# Patient Record
Sex: Female | Born: 2000
Health system: Southern US, Community
[De-identification: ages and names within clinical notes are randomized; demographics above are authoritative.]

## PROBLEM LIST (undated history)

## (undated) DIAGNOSIS — T7840XA Allergy, unspecified, initial encounter: Secondary | ICD-10-CM

## (undated) DIAGNOSIS — Z975 Presence of (intrauterine) contraceptive device: Secondary | ICD-10-CM

## (undated) DIAGNOSIS — F419 Anxiety disorder, unspecified: Secondary | ICD-10-CM

## (undated) HISTORY — PX: WISDOM TOOTH EXTRACTION: SHX21

## (undated) HISTORY — DX: Anxiety disorder, unspecified: F41.9

## (undated) HISTORY — DX: Presence of (intrauterine) contraceptive device: Z97.5

---
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
## 2020-07-22 IMAGING — DX PORTABLE CHEST - 1 VIEW
1 series · 1 of 1 positions shown · non-contrast
Comparison: None.

CLINICAL DATA: Cough and shortness of breath.

EXAM:
PORTABLE CHEST 1 VIEW

[chest]
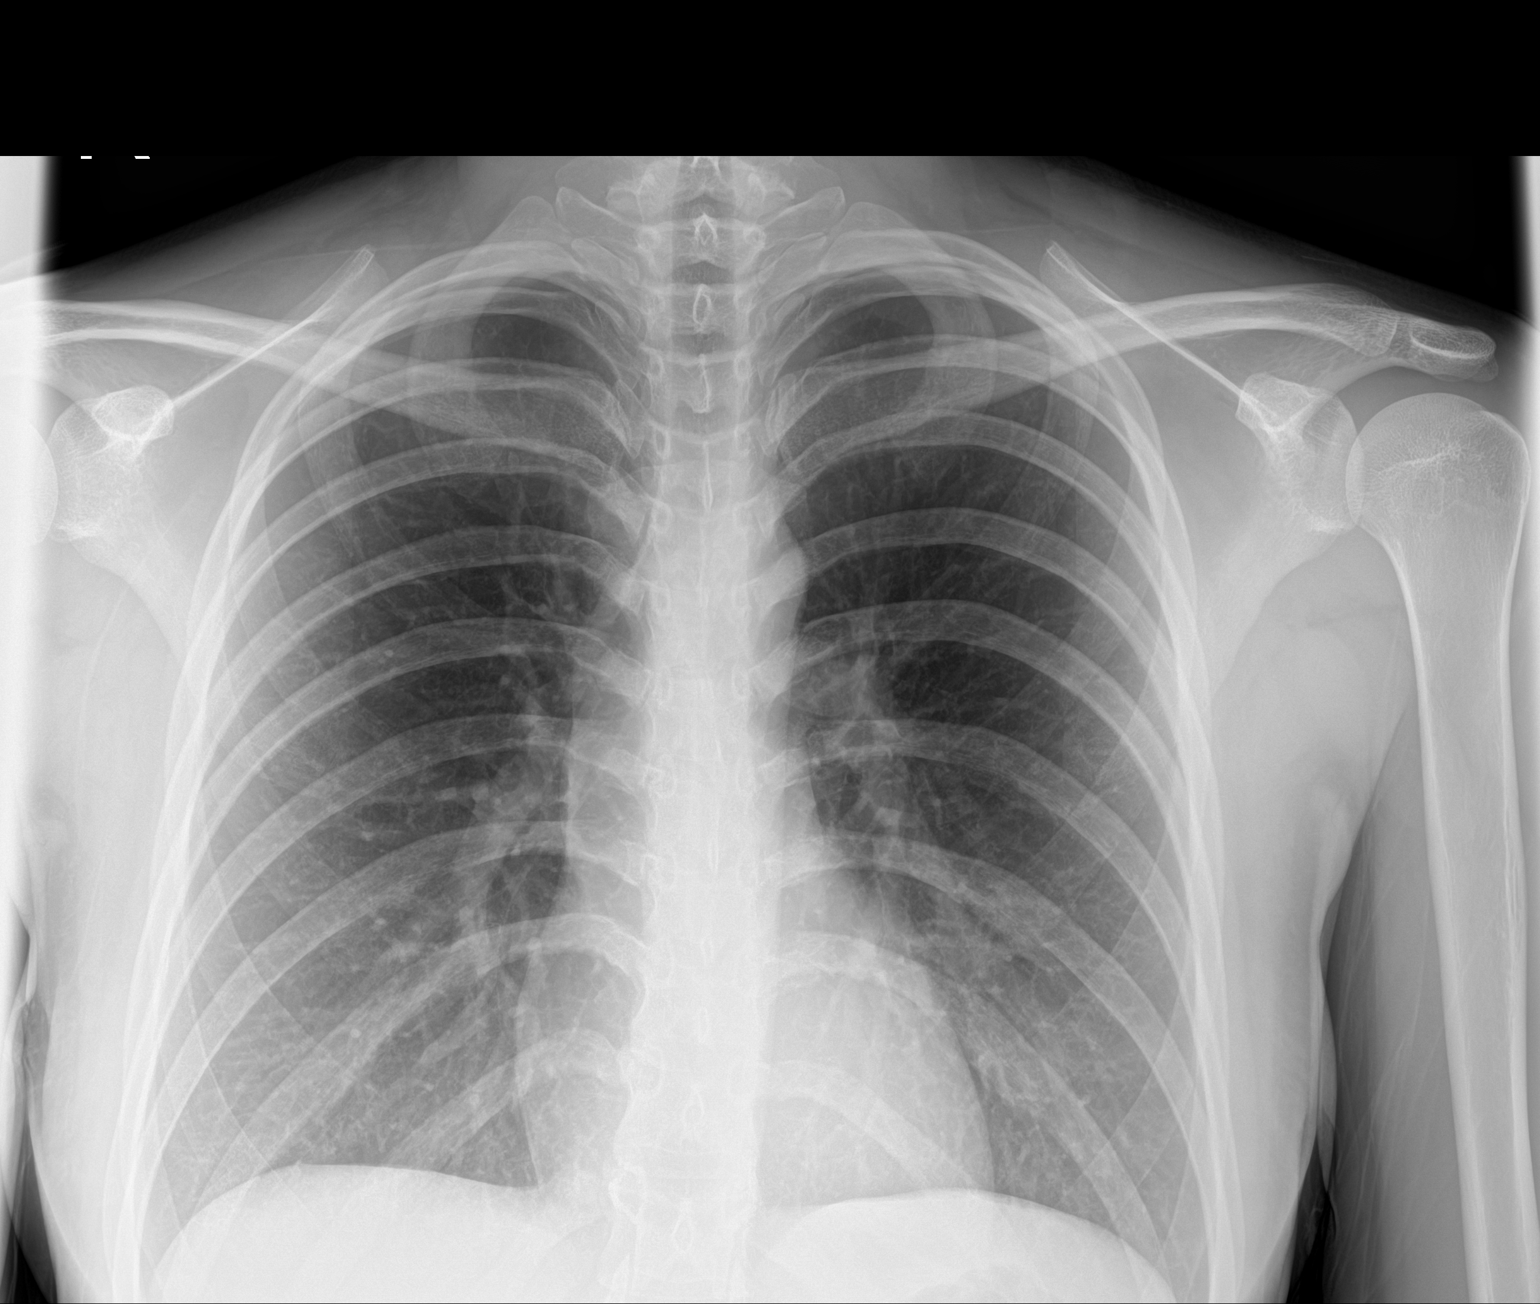

[1 of 1 positions shown; findings below may reference images not displayed]

FINDINGS: The cardiomediastinal contours are normal. The lungs are clear.
Pulmonary vasculature is normal. No consolidation, pleural effusion,
or pneumothorax. No acute osseous abnormalities are seen.
IMPRESSION: Negative radiograph of the chest.

## 2020-09-22 IMAGING — CT CT ABDOMEN AND PELVIS WITH CONTRAST
2 of 4 series · 16 of 46 positions shown, 18 images · IV contrast (omnipaque)
Comparison: 02/28/2019

CLINICAL DATA: Right lower quadrant pain for 2 days

EXAM:
CT ABDOMEN AND PELVIS WITH CONTRAST
TECHNIQUE: Multidetector CT imaging of the abdomen and pelvis was performed
using the standard protocol following bolus administration of
intravenous contrast.
CONTRAST:  100mL OMNIPAQUE IOHEXOL 300 MG/ML  SOLN

[Series 3: abd/ pelvis 5.0 i30f 2 · axial · 0.70mm/px · z∈[-686,-322]mm · 13 of 81 slices shown, 15 images]
[im 4/81  soft-tissue]
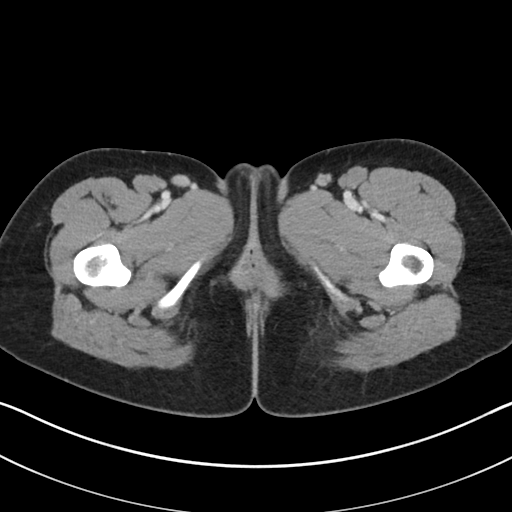
[im 4/81  bone]
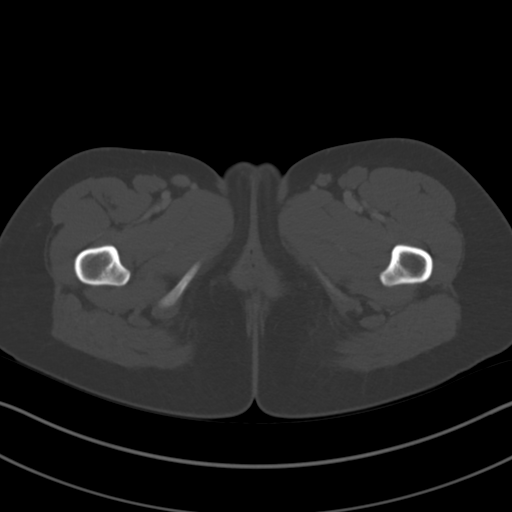
[im 10/81  soft-tissue]
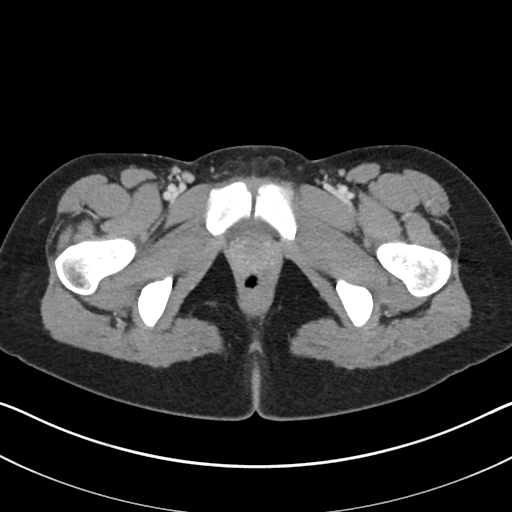
[im 17/81  soft-tissue]
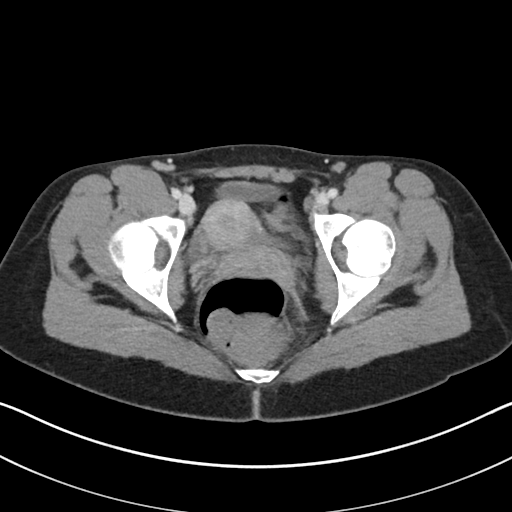
[im 23/81  soft-tissue]
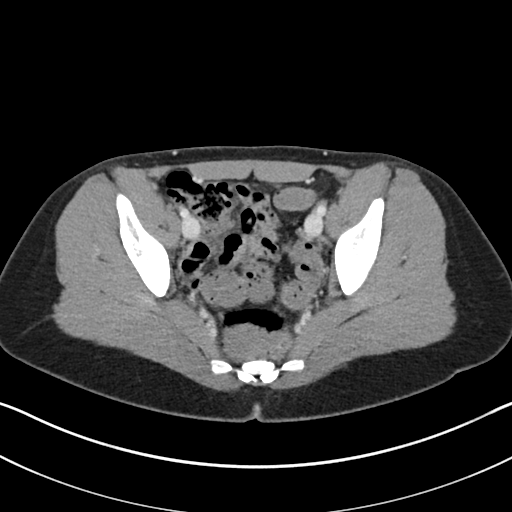
[im 29/81  soft-tissue]
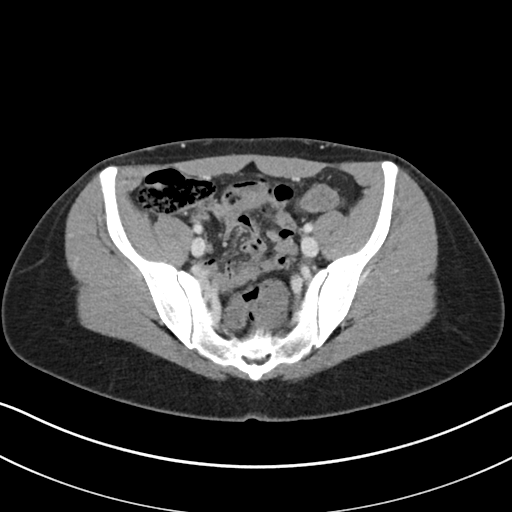
[im 36/81  soft-tissue]
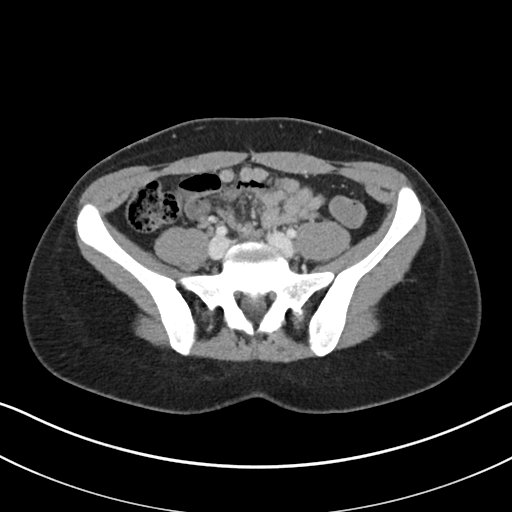
[im 42/81  soft-tissue]
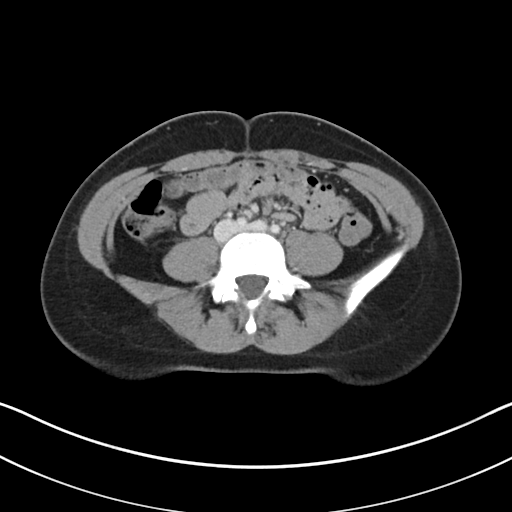
[im 45/81  soft-tissue]
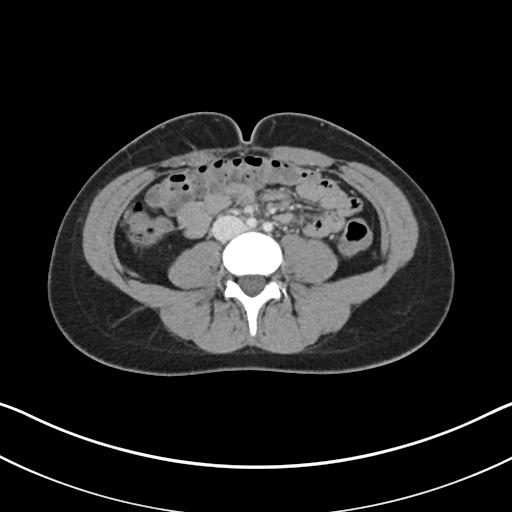
[im 52/81  soft-tissue]
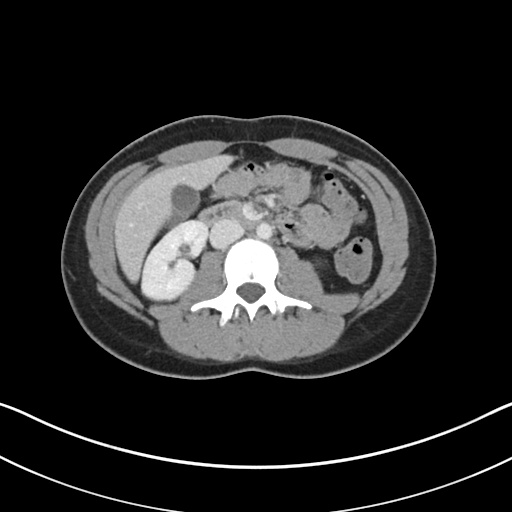
[im 52/81  bone]
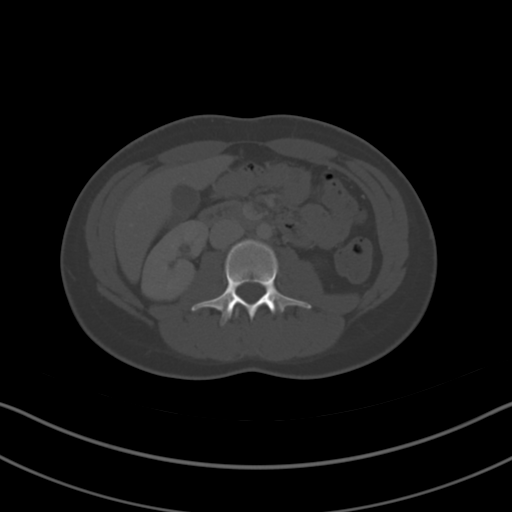
[im 58/81  soft-tissue]
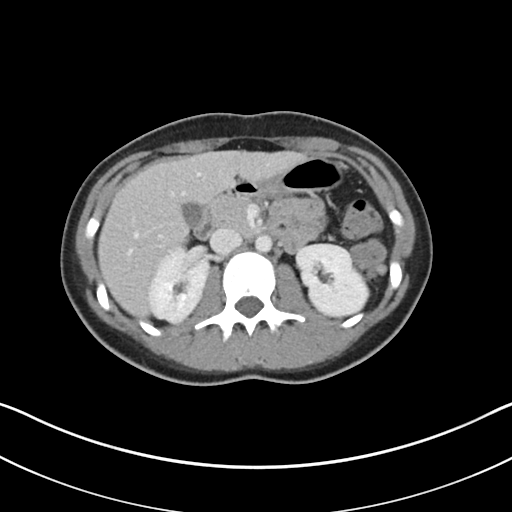
[im 65/81  soft-tissue]
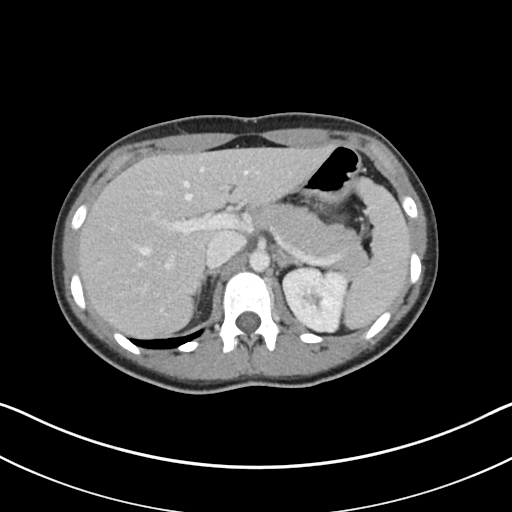
[im 71/81  soft-tissue]
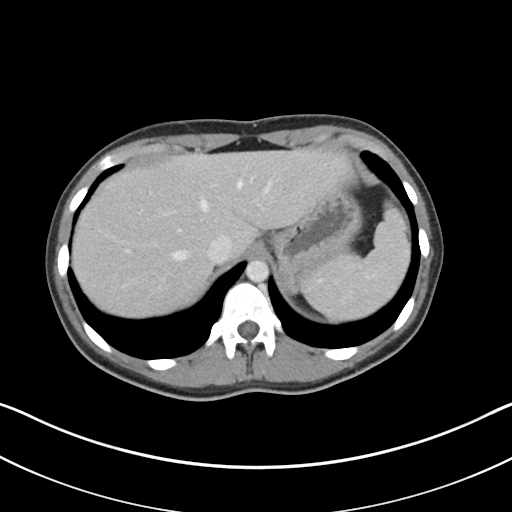
[im 77/81  soft-tissue]
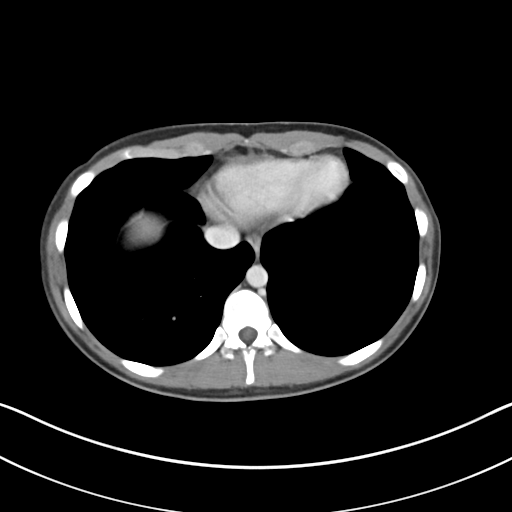

[Series 6: coronal soft tissue · coronal · 0.67mm/px · 3 of 101 slices shown]
[im 34/101  soft-tissue]
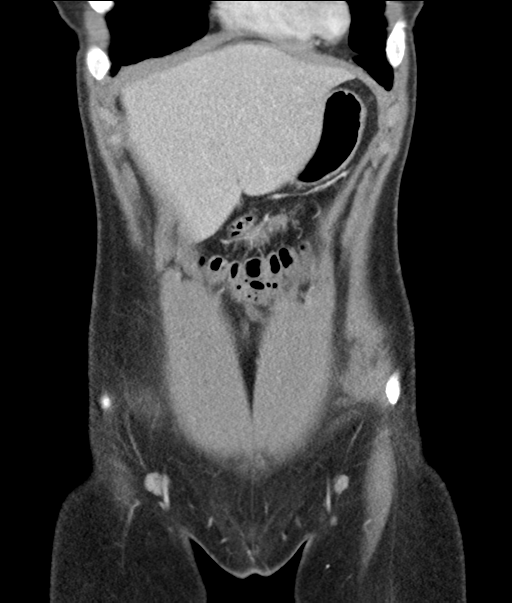
[im 45/101  soft-tissue]
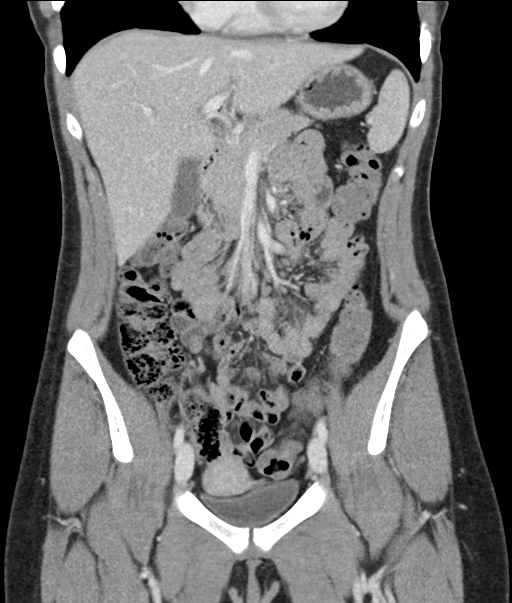
[im 56/101  soft-tissue]
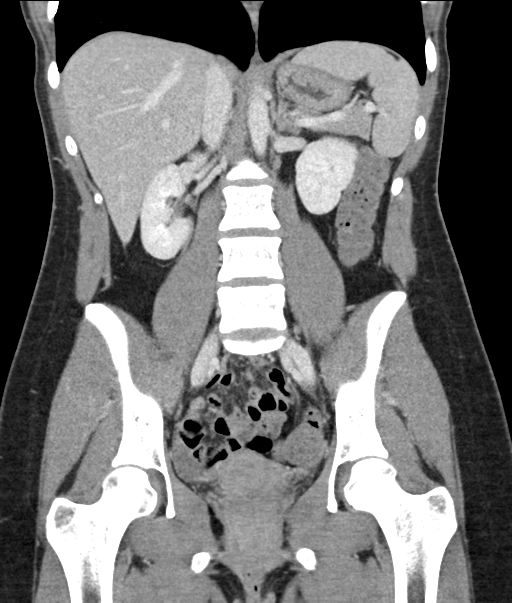

[16 of 46 positions shown; findings below may reference images not displayed]

FINDINGS: Lower chest: No acute abnormality.

Hepatobiliary: No focal liver abnormality is seen. No gallstones,
gallbladder wall thickening, or biliary dilatation.

Pancreas: Unremarkable. No pancreatic ductal dilatation or
surrounding inflammatory changes.

Spleen: Normal in size without focal abnormality.

Adrenals/Urinary Tract: Adrenal glands are within normal limits.
Kidneys demonstrate a normal enhancement pattern bilaterally. No
obstructive changes are seen. The bladder is partially distended.

Stomach/Bowel: The appendix is within normal limits. No obstructive
or inflammatory changes are seen. Stomach is within normal limits.

Vascular/Lymphatic: No significant vascular findings are present. No
enlarged abdominal or pelvic lymph nodes.

Reproductive: Uterus and bilateral adnexa are unremarkable.

Other: No abdominal wall hernia or abnormality. No abdominopelvic
ascites.

Musculoskeletal: No acute or significant osseous findings.
IMPRESSION: No evidence of acute appendicitis.  No acute abnormality noted.

## 2021-04-30 ENCOUNTER — Other Ambulatory Visit: Payer: Self-pay | Admitting: Surgery

## 2021-04-30 DIAGNOSIS — R1011 Right upper quadrant pain: Secondary | ICD-10-CM

## 2021-05-04 ENCOUNTER — Encounter (HOSPITAL_COMMUNITY)
Admission: RE | Admit: 2021-05-04 | Discharge: 2021-05-04 | Disposition: A | Discharge status: Home / Self Care | Payer: 59 | Source: Ambulatory Visit | Attending: Surgery | Admitting: Surgery

## 2021-05-04 ENCOUNTER — Other Ambulatory Visit: Payer: Self-pay

## 2021-05-04 DIAGNOSIS — R1011 Right upper quadrant pain: Secondary | ICD-10-CM | POA: Diagnosis present

## 2021-05-04 MED ORDER — TECHNETIUM TC 99M MEBROFENIN IV KIT
5.2000 | PACK | Freq: Once | INTRAVENOUS | Status: AC | PRN
  Administered 2021-05-04: 5.2 via INTRAVENOUS

## 2021-05-14 ENCOUNTER — Encounter: Payer: Self-pay | Admitting: Gastroenterology

## 2021-05-14 ENCOUNTER — Telehealth: Payer: Self-pay | Admitting: Gastroenterology

## 2021-05-14 NOTE — Telephone Encounter (Signed)
Schedule OV 10/4

## 2021-05-14 NOTE — Telephone Encounter (Signed)
I am happy to see her as a new patient.  Please offer her my first available new patient appointment.  Do not double book and do not book with an extender place thank you

## 2021-05-14 NOTE — Telephone Encounter (Signed)
Hey Dr. Christella Hartigan,   We received a referral for transfer of care for postprandial abd pain RUQ. She was recently seen at [facility] in [place]. She would like to see you as you are local. Records are in Epic. Could you please review and advise on scheduling?  Thank you.

## 2021-07-10 ENCOUNTER — Ambulatory Visit (INDEPENDENT_AMBULATORY_CARE_PROVIDER_SITE_OTHER): Payer: 59 | Admitting: Gastroenterology

## 2021-07-10 ENCOUNTER — Encounter: Payer: Self-pay | Admitting: Gastroenterology

## 2021-07-10 DIAGNOSIS — K219 Gastro-esophageal reflux disease without esophagitis: Secondary | ICD-10-CM

## 2021-07-10 DIAGNOSIS — R109 Unspecified abdominal pain: Secondary | ICD-10-CM | POA: Diagnosis not present

## 2021-07-10 MED ORDER — FAMOTIDINE 20 MG PO TABS: 20.0000 mg | ORAL_TABLET | Freq: Every day | ORAL | 3 refills | Status: DC

## 2021-07-10 MED ORDER — OMEPRAZOLE 20 MG PO CPDR: 20.0000 mg | DELAYED_RELEASE_CAPSULE | Freq: Every day | ORAL | 3 refills | Status: DC

## 2021-07-10 NOTE — Patient Instructions (Signed)
If you are age 20 or younger, your body mass index should be between 19-25. Your Body mass index is 27.63 kg/m. If this is out of the aformentioned range listed, please consider follow up with your Primary Care Provider.  __________________________________________________________  The [facility] providers would like to encourage you to use [facility] to communicate with providers for non-urgent requests or questions.  Due to long hold times on the telephone, sending your provider a message by [facility] may be a faster and more efficient way to get a response.  Please allow 48 business hours for a response.  Please remember that this is for non-urgent requests.   You have been scheduled for an endoscopy. Please follow written instructions given to you at your visit today. If you use inhalers (even only as needed), please bring them with you on the day of your procedure.  Due to recent changes in healthcare laws, you may see the results of your imaging and laboratory studies on MyChart before your provider has had a chance to review them.  We understand that in some cases there may be results that are confusing or concerning to you. Not all laboratory results come back in the same time frame and the provider may be waiting for multiple results in order to interpret others.  Please give [place] 48 hours in order for your provider to thoroughly review all the results before contacting the office for clarification of your results.   We have sent the following medications to your pharmacy for you to pick up at your convenience:  START: omeprazole one capsule daily 20 to 30 minutes prior to breakfast.  START: Pepcid 20mg  one tablet every night at bedtime.  Thank you for entrusting me with your care and choosing [facility].  Dr [facility]

## 2021-07-10 NOTE — Progress Notes (Signed)
HPI: This is a very pleasant 20 year old woman who was referred to me by Manus Rudd, MD  to evaluate GERD, abdominal pain.    Since she was born she has had difficulties with her stomach.  Her mother told her she used to vomit a lot as an infant.  She has had acid reflux type issues for many years.  She points back to 2015 when she saw gastroenterologist who put her on 3 weeks of ranitidine and she felt better.  More recently she has had worsening burning in her chest acid taste in her mouth.  Also postprandial epigastric and right upper quadrant discomfort associate with a lot of bloating.  She avoids dairy and this helps a bit with some symptoms.  She takes Gaviscon periodically and that sometimes helps.  She will wake up just about every morning and have a lot of belching.  She has been told this is IBS.  She drinks 1-2 espresso coffees a day.  She does not usually eat within 2 to 3 hours of laying down for bed.  She takes NSAIDs about twice a week.  She is not a very heavy alcohol drinker.  Her weight is overall stable.  She has no dysphagia.  Her bowels are normal, regular.  Old Data Reviewed:  Ultrasound right upper quadrant May 2022 indication epigastric pain findings normal exam. Ultrasound right upper quadrant July 2022 indication right upper quadrant pain and nausea.  Findings "gallbladder is contracted.  No appreciable cholelithiasis or evidence of cholecystitis.  Mild hepatic steatosis.  HIDA scan July 2022 indication "postprandial epigastric pain.  Findings normal  Blood work through [facility] 2022 celiac sprue TTG and total IgA were normal.  Complete metabolic panel normal, lipase normal    Review of systems: Pertinent positive and negative review of systems were noted in the above HPI section. All other review negative.   Past Medical History:  Diagnosis Date   Allergies     History reviewed. No pertinent surgical history.  Current Outpatient Medications  Medication Sig  Dispense Refill   DULoxetine HCl (CYMBALTA PO) Take 40 mg by mouth daily.     Levonorgestrel (LILETTA, 52 MG, IU) by Intrauterine route.     No current facility-administered medications for this visit.    Allergies as of 07/10/2021   (No Known Allergies)    Family History  Problem Relation Age of Onset   Colon polyps Neg Hx    Colonic polyp Neg Hx    Rectal cancer Neg Hx    Esophageal cancer Neg Hx    Stomach cancer Neg Hx     Social History   Socioeconomic History   Marital status: Single    Spouse name: Not on file   Number of children: Not on file   Years of education: Not on file   Highest education level: Not on file  Occupational History   Occupation: student  Tobacco Use   Smoking status: Never   Smokeless tobacco: Never  Substance and Sexual Activity   Alcohol use: Never   Drug use: Never   Sexual activity: Not on file  Other Topics Concern   Not on file  Social History Narrative   Not on file   Social Determinants of Health   Financial Resource Strain: Not on file  Food Insecurity: Not on file  Transportation Needs: Not on file  Physical Activity: Not on file  Stress: Not on file  Social Connections: Not on file  Intimate Partner Violence: Not on  file     Physical Exam: Ht 5\' 1"  (1.549 m)   Wt 146 lb 4 oz (66.3 kg)   BMI 27.63 kg/m  Constitutional: generally well-appearing Psychiatric: alert and oriented x3 Eyes: extraocular movements intact Mouth: oral pharynx moist, no lesions Neck: supple no lymphadenopathy Cardiovascular: heart regular rate and rhythm Lungs: clear to auscultation bilaterally Abdomen: soft, nontender, nondistended, no obvious ascites, no peritoneal signs, normal bowel sounds Extremities: no lower extremity edema bilaterally Skin: no lesions on visible extremities   Assessment and plan: 20 y.o. female with GERD, epigastric and right upper quadrant postprandial discomforts  I do think that acid is probably playing a  role in her symptoms.  H2 blockers in the past have helped.  I recommended she start taking Pepcid 20 mg 1 pill every night at bedtime and also omeprazole 20 mg every morning shortly before her first meal of the day.  I think that this would be a good test and also treatment for any acid related issues.  I also recommended that we proceed with EGD at her soonest convenience to check for significant gastritis, peptic ulcer disease, H. pylori, pyloric stricture given her vomiting as an infant.  I see no reason for any further blood tests or imaging studies prior to then.   Please see the "Patient Instructions" section for addition details about the plan.   26, MD [facility] 07/10/2021, 8:19 AM  Cc: 09/09/2021, MD  Total time on date of encounter was 45 minutes (this included time spent preparing to see the patient reviewing records; obtaining and/or reviewing separately obtained history; performing a medically appropriate exam and/or evaluation; counseling and educating the patient and family if present; ordering medications, tests or procedures if applicable; and documenting clinical information in the health record).

## 2021-07-30 ENCOUNTER — Encounter: Payer: Self-pay | Admitting: Gastroenterology

## 2021-07-30 ENCOUNTER — Ambulatory Visit (AMBULATORY_SURGERY_CENTER): Payer: 59 | Admitting: Gastroenterology

## 2021-07-30 ENCOUNTER — Other Ambulatory Visit: Payer: Self-pay

## 2021-07-30 VITALS — BP 98/66 | HR 60 | Temp 99.1°F | Resp 20 | Ht 61.0 in | Wt 146.0 lb

## 2021-07-30 DIAGNOSIS — K219 Gastro-esophageal reflux disease without esophagitis: Secondary | ICD-10-CM | POA: Diagnosis not present

## 2021-07-30 DIAGNOSIS — K2970 Gastritis, unspecified, without bleeding: Secondary | ICD-10-CM

## 2021-07-30 DIAGNOSIS — R12 Heartburn: Secondary | ICD-10-CM | POA: Diagnosis present

## 2021-07-30 DIAGNOSIS — R109 Unspecified abdominal pain: Secondary | ICD-10-CM

## 2021-07-30 MED ORDER — SODIUM CHLORIDE 0.9 % IV SOLN: 500.0000 mL | INTRAVENOUS | Status: DC

## 2021-07-30 NOTE — Progress Notes (Signed)
To PACU, VSS. Report to [email or web address] 

## 2021-07-30 NOTE — Progress Notes (Signed)
Called to room to assist during endoscopic procedure.  Patient ID and intended procedure confirmed with present staff. Received instructions for my participation in the procedure from the performing physician.  

## 2021-07-30 NOTE — Patient Instructions (Addendum)
Awaiting pathology results. Will consider gastric emptying scan if biopsies show no H. Pylori. Resume previous diet and current medications.  YOU HAD AN ENDOSCOPIC PROCEDURE TODAY AT THE [facility]:   Refer to the procedure report that was given to you for any specific questions about what was found during the examination.  If the procedure report does not answer your questions, please call your gastroenterologist to clarify.  If you requested that your care partner not be given the details of your procedure findings, then the procedure report has been included in a sealed envelope for you to review at your convenience later.  YOU SHOULD EXPECT: Some feelings of bloating in the abdomen. Passage of more gas than usual.  Walking can help get rid of the air that was put into your GI tract during the procedure and reduce the bloating. If you had a lower endoscopy (such as a colonoscopy or flexible sigmoidoscopy) you may notice spotting of blood in your stool or on the toilet paper. If you underwent a bowel prep for your procedure, you may not have a normal bowel movement for a few days.  Please Note:  You might notice some irritation and congestion in your nose or some drainage.  This is from the oxygen used during your procedure.  There is no need for concern and it should clear up in a day or so.  SYMPTOMS TO REPORT IMMEDIATELY:   Following upper endoscopy (EGD)  Vomiting of blood or coffee ground material  New chest pain or pain under the shoulder blades  Painful or persistently difficult swallowing  New shortness of breath  Fever of 100F or higher  Black, tarry-looking stools  For urgent or emergent issues, a gastroenterologist can be reached at any hour by calling [phone]. Do not use MyChart messaging for urgent concerns.    DIET:  We do recommend a small meal at first, but then you may proceed to your regular diet.  Drink plenty of fluids but you should avoid alcoholic  beverages for 24 hours.  ACTIVITY:  You should plan to take it easy for the rest of today and you should NOT DRIVE or use heavy machinery until tomorrow (because of the sedation medicines used during the test).    FOLLOW UP: Our staff will call the number listed on your records 48-72 hours following your procedure to check on you and address any questions or concerns that you may have regarding the information given to you following your procedure. If we do not reach you, we will leave a message.  We will attempt to reach you two times.  During this call, we will ask if you have developed any symptoms of COVID 19. If you develop any symptoms (ie: fever, flu-like symptoms, shortness of breath, cough etc.) before then, please call [phone].  If you test positive for Covid 19 in the 2 weeks post procedure, please call and report this information to [place].    If any biopsies were taken you will be contacted by phone or by letter within the next 1-3 weeks.  Please call us at [phone] if you have not heard about the biopsies in 3 weeks.    SIGNATURES/CONFIDENTIALITY: You and/or your care partner have signed paperwork which will be entered into your electronic medical record.  These signatures attest to the fact that that the information above on your After Visit Summary has been reviewed and is understood.  Full responsibility of the confidentiality of this discharge  information lies with you and/or your care-partner.

## 2021-07-30 NOTE — Progress Notes (Signed)
  The recent H&P (dated 07/08/2021) was reviewed, the patient was examined and there is no change in the patients condition since that H&P was completed.   Laura Marquez  07/30/2021, 10:07 AM

## 2021-07-30 NOTE — Op Note (Signed)
[facility] Patient Name: Laura Marquez Procedure Date: 07/30/2021 10:25 AM MRN: [id number] Endoscopist: Rachael Fee , MD Age: 20 Referring MD:  Date of Birth: July 04, 2001 Gender: Female Account #: [id number] Procedure:                Upper GI endoscopy Indications:              Abdominal pain in the right upper quadrant,                            Heartburn Medicines:                Monitored Anesthesia Care Procedure:                Pre-Anesthesia Assessment:                           - Prior to the procedure, a History and Physical                            was performed, and patient medications and                            allergies were reviewed. The patient's tolerance of                            previous anesthesia was also reviewed. The risks                            and benefits of the procedure and the sedation                            options and risks were discussed with the patient.                            All questions were answered, and informed consent                            was obtained. Prior Anticoagulants: The patient has                            taken no previous anticoagulant or antiplatelet                            agents. ASA Grade Assessment: II - A patient with                            mild systemic disease. After reviewing the risks                            and benefits, the patient was deemed in                            satisfactory condition to undergo the procedure.  After obtaining informed consent, the endoscope was                            passed under direct vision. Throughout the                            procedure, the patient's blood pressure, pulse, and                            oxygen saturations were monitored continuously. The                            Endoscope was introduced through the mouth, and                            advanced to the second part of duodenum. The  upper                            GI endoscopy was accomplished without difficulty.                            The patient tolerated the procedure well. Scope In: Scope Out: Findings:                 Small to moderate amount of retained gastric                            secretions without anatomic outlet obstruction.                           Mild inflammation characterized by erythema and                            friability was found in the gastric antrum.                            Biopsies were taken with a cold forceps for                            histology.                           The exam was otherwise without abnormality. Complications:            No immediate complications. Estimated blood loss:                            None. Estimated Blood Loss:     Estimated blood loss: none. Impression:               - Mild, non-specific gastritis. Biopsied to check                            for H. pylori.                           -  Small to moderate amount of retained gastric                            secretions without anatomic outlet obstruction.                           - The examination was otherwise normal. Recommendation:           - Await pathology results. Will consider gastric                            emptying scan if biopsies show no H. pylori.                           - Patient has a contact number available for                            emergencies. The signs and symptoms of potential                            delayed complications were discussed with the                            patient. Return to normal activities tomorrow.                            Written discharge instructions were provided to the                            patient.                           - Resume previous diet.                           - Continue present medications. Rachael Fee, MD 07/30/2021 10:39:41 AM This report has been signed electronically.

## 2021-08-01 ENCOUNTER — Encounter: Payer: 59 | Admitting: Gastroenterology

## 2021-08-01 ENCOUNTER — Telehealth: Payer: Self-pay

## 2021-08-01 NOTE — Telephone Encounter (Signed)
Left message on answering machine. 

## 2021-08-06 ENCOUNTER — Other Ambulatory Visit: Payer: Self-pay

## 2021-08-06 DIAGNOSIS — R109 Unspecified abdominal pain: Secondary | ICD-10-CM

## 2021-08-06 DIAGNOSIS — K3189 Other diseases of stomach and duodenum: Secondary | ICD-10-CM

## 2021-09-12 ENCOUNTER — Other Ambulatory Visit: Payer: Self-pay

## 2021-09-12 ENCOUNTER — Encounter (HOSPITAL_COMMUNITY)
Admission: RE | Admit: 2021-09-12 | Discharge: 2021-09-12 | Disposition: A | Discharge status: Home / Self Care | Payer: 59 | Source: Ambulatory Visit | Attending: Gastroenterology | Admitting: Gastroenterology

## 2021-09-12 DIAGNOSIS — R109 Unspecified abdominal pain: Secondary | ICD-10-CM | POA: Insufficient documentation

## 2021-09-12 DIAGNOSIS — K3189 Other diseases of stomach and duodenum: Secondary | ICD-10-CM

## 2021-09-12 MED ORDER — TECHNETIUM TC 99M SULFUR COLLOID
1.8000 | Freq: Once | INTRAVENOUS | Status: AC
  Administered 2021-09-12: 1.8 via INTRAVENOUS

## 2021-10-06 ENCOUNTER — Other Ambulatory Visit: Payer: Self-pay | Admitting: Gastroenterology

## 2022-08-27 ENCOUNTER — Emergency Department (HOSPITAL_BASED_OUTPATIENT_CLINIC_OR_DEPARTMENT_OTHER)
Admission: EM | Admit: 2022-08-27 | Discharge: 2022-08-27 | Disposition: A | Discharge status: Home / Self Care | Payer: 59 | Attending: Emergency Medicine | Admitting: Emergency Medicine

## 2022-08-27 ENCOUNTER — Other Ambulatory Visit: Payer: Self-pay

## 2022-08-27 ENCOUNTER — Encounter (HOSPITAL_BASED_OUTPATIENT_CLINIC_OR_DEPARTMENT_OTHER): Payer: Self-pay

## 2022-08-27 DIAGNOSIS — T161XXA Foreign body in right ear, initial encounter: Secondary | ICD-10-CM | POA: Insufficient documentation

## 2022-08-27 DIAGNOSIS — H9201 Otalgia, right ear: Secondary | ICD-10-CM | POA: Diagnosis present

## 2022-08-27 DIAGNOSIS — W4904XA Ring or other jewelry causing external constriction, initial encounter: Secondary | ICD-10-CM | POA: Diagnosis not present

## 2022-08-27 DIAGNOSIS — H6001 Abscess of right external ear: Secondary | ICD-10-CM | POA: Diagnosis not present

## 2022-08-27 MED ORDER — DOXYCYCLINE HYCLATE 100 MG PO CAPS: 100.0000 mg | ORAL_CAPSULE | Freq: Two times a day (BID) | ORAL | 0 refills | Status: AC

## 2022-08-27 MED ORDER — LIDOCAINE HCL URETHRAL/MUCOSAL 2 % EX GEL: 1.0000 | Freq: Once | CUTANEOUS | Status: DC

## 2022-08-27 NOTE — ED Notes (Signed)
Laura Blue,PA had already gotten Lidocaine jelly prior to when I pulled it. My med will be returned.

## 2022-08-27 NOTE — ED Triage Notes (Addendum)
Patient here POV from Home.  Endorses having an Earring that has become stuck 2 days ago and has become swollen. Right Upper Ear. Swelling noted.  Some Yellow Drainage. No Fevers.   NAD Noted during Triage. A&Ox4. Gcs 15. Ambulatory.

## 2022-08-27 NOTE — Discharge Instructions (Addendum)
It was a pleasure taking care of you today!  The ear ring was removed in the emergency department today.  We sent a prescription for doxycycline, take as directed.  The area may continue to drain.  Keep the area clean and dry.  Return to the emergency department if you experience increasing/worsening symptoms.

## 2022-08-27 NOTE — ED Provider Notes (Signed)
[facility] EMERGENCY DEPT Provider Note   CSN: [id number] Arrival date & time: 08/27/22  2212     History  Chief Complaint  Patient presents with   Ear Pain    Laura Marquez is a 21 y.o. female who presents to the emergency department with concerns for right ear pain onset PTA. Notes that she has had drainage to the ear before in the past.  Notes that she has had the ear piercing for the past 2 years.  Unable to remove the ear piercing at home.  Has associated light yellow drainage.  Denies fever.  The history is provided by the patient. No language interpreter was used.       Home Medications Prior to Admission medications   Medication Sig Start Date End Date Taking? Authorizing Provider  doxycycline (VIBRAMYCIN) 100 MG capsule Take 1 capsule (100 mg total) by mouth 2 (two) times daily. 08/27/22  Yes Pearlina Friedly A, PA-C  DULoxetine HCl (CYMBALTA PO) Take 40 mg by mouth daily.    [provider]  famotidine (PEPCID) 20 MG tablet TAKE 1 TABLET BY MOUTH EVERYDAY AT BEDTIME 10/10/21   Milus Banister, MD  Levonorgestrel (LILETTA, 52 MG, IU) by Intrauterine route.    [provider]  omeprazole (PRILOSEC) 20 MG capsule TAKE 1 CAPSULE (20 MG TOTAL) BY MOUTH DAILY. TAKE ONE CAPSULE 20 TO 30 MINUTES PRIOR TO BREAKFAST 10/10/21   Milus Banister, MD      Allergies    Patient has no known allergies.    Review of Systems   Review of Systems  All other systems reviewed and are negative.   Physical Exam Updated Vital Signs BP (!) 124/96 (BP Location: Right Arm)   Pulse 78   Temp 98.1 F (36.7 C) (Temporal)   Resp 18   Ht 5\' 1"  (1.549 m)   Wt 66.2 kg   SpO2 100%   BMI 27.58 kg/m  Physical Exam Vitals and nursing note reviewed.  Constitutional:      General: She is not in acute distress.    Appearance: Normal appearance.  HENT:     Ears:      Comments: Right external ear with piercing in place, drainage expressed on palpation of ear.   Mild erythema and swelling noted to ear. Eyes:     General: No scleral icterus.    Extraocular Movements: Extraocular movements intact.  Cardiovascular:     Rate and Rhythm: Normal rate.  Pulmonary:     Effort: Pulmonary effort is normal. No respiratory distress.  Abdominal:     Palpations: Abdomen is soft. There is no mass.     Tenderness: There is no abdominal tenderness.  Musculoskeletal:        General: Normal range of motion.     Cervical back: Neck supple.  Skin:    General: Skin is warm and dry.     Findings: No rash.  Neurological:     Mental Status: She is alert.     Sensory: Sensation is intact.     Motor: Motor function is intact.  Psychiatric:        Behavior: Behavior normal.     ED Results / Procedures / Treatments   Labs (all labs ordered are listed, but only abnormal results are displayed) Labs Reviewed - No data to display  EKG None  Radiology No results found.  Procedures .Foreign Body Removal  Date/Time: 08/27/2022 11:33 PM  Performed by: Nehemiah Settle, PA-C Authorized by: Oren Binet,  Shaquel Josephson A, PA-C  Consent: Verbal consent obtained. Risks and benefits: risks, benefits and alternatives were discussed Consent given by: patient Patient identity confirmed: verbally with patient and hospital-assigned identification number Time out: Immediately prior to procedure a "time out" was called to verify the correct patient, procedure, equipment, support staff and site/side marked as required. Body area: ear Location details: right ear  Anesthesia: Local Anesthetic: topical anesthetic  Sedation: Patient sedated: no  Complexity: simple 1 objects recovered. Objects recovered: ear ring Post-procedure assessment: foreign body removed      Medications Ordered in ED Medications  lidocaine (XYLOCAINE) 2 % jelly 1 Application (has no administration in time range)    ED Course/ Medical Decision Making/ A&P                           Medical Decision  Making  Pt presents with right ear drainage due to infected earring.  Patient afebrile.  On exam patient with Right external ear with piercing in place, drainage expressed on palpation of ear.  Mild erythema and swelling noted to ear. Differential diagnosis includes abscess, cellulitis.   Medications:  I ordered medication including topical lidocaine for pain management Reevaluation of the patient after these medicines and interventions, I reevaluated the patient and found that they have improved I have reviewed the patients home medicines and have made adjustments as needed   Disposition: Presenting suspicious for abscess to external ear.  Doubt cellulitis at this time. After consideration of the diagnostic results and the patients response to treatment, I feel that the patient would benefit from Discharge home.  Prescription for doxycycline sent to patient pharmacy.  Supportive care measures and strict return precautions discussed with patient at bedside. Pt acknowledges and verbalizes understanding. Pt appears safe for discharge. Follow up as indicated in discharge paperwork.    This chart was dictated using voice recognition software, Dragon. Despite the best efforts of this provider to proofread and correct errors, errors may still occur which can change documentation meaning.   Final Clinical Impression(s) / ED Diagnoses Final diagnoses:  Abscess of right external ear    Rx / DC Orders ED Discharge Orders          Ordered    doxycycline (VIBRAMYCIN) 100 MG capsule  2 times daily        08/27/22 2333              Kire Ferg A, PA-C 08/27/22 2336    Pollyann Savoy, MD 08/28/22 [phone]
